# Patient Record
Sex: Female | Born: 1965 | Race: White | Hispanic: Yes | Marital: Married | Smoking: Light tobacco smoker
Health system: Southern US, Community
[De-identification: ages and names within clinical notes are randomized; demographics above are authoritative.]

## PROBLEM LIST (undated history)

## (undated) DIAGNOSIS — F419 Anxiety disorder, unspecified: Secondary | ICD-10-CM

## (undated) HISTORY — DX: Anxiety disorder, unspecified: F41.9

## (undated) HISTORY — PX: TUBAL LIGATION: SHX77

## (undated) HISTORY — PX: BIOPSY THYROID: PRO38

## (undated) HISTORY — PX: UPPER GASTROINTESTINAL ENDOSCOPY: SHX188

---

## 2002-04-11 ENCOUNTER — Inpatient Hospital Stay (HOSPITAL_COMMUNITY): Admission: AD | Admit: 2002-04-11 | Discharge: 2002-04-11 | Payer: Self-pay | Admitting: Obstetrics and Gynecology

## 2002-04-17 ENCOUNTER — Encounter (HOSPITAL_COMMUNITY): Admission: RE | Admit: 2002-04-17 | Discharge: 2002-05-17 | Payer: Self-pay | Admitting: *Deleted

## 2002-04-26 ENCOUNTER — Encounter: Admission: RE | Admit: 2002-04-26 | Discharge: 2002-05-22 | Payer: Self-pay | Admitting: *Deleted

## 2002-05-02 ENCOUNTER — Encounter: Admission: RE | Admit: 2002-05-02 | Discharge: 2002-05-02 | Payer: Self-pay | Admitting: *Deleted

## 2002-05-09 ENCOUNTER — Encounter: Admission: RE | Admit: 2002-05-09 | Discharge: 2002-05-09 | Payer: Self-pay | Admitting: *Deleted

## 2002-05-16 ENCOUNTER — Encounter: Admission: RE | Admit: 2002-05-16 | Discharge: 2002-05-16 | Payer: Self-pay | Admitting: *Deleted

## 2002-05-23 ENCOUNTER — Encounter: Admission: RE | Admit: 2002-05-23 | Discharge: 2002-05-23 | Payer: Self-pay | Admitting: *Deleted

## 2002-05-24 ENCOUNTER — Inpatient Hospital Stay (HOSPITAL_COMMUNITY): Admission: AD | Admit: 2002-05-24 | Discharge: 2002-05-24 | Payer: Self-pay | Admitting: *Deleted

## 2002-05-30 ENCOUNTER — Encounter: Admission: RE | Admit: 2002-05-30 | Discharge: 2002-05-30 | Payer: Self-pay | Admitting: *Deleted

## 2002-06-01 ENCOUNTER — Encounter (INDEPENDENT_AMBULATORY_CARE_PROVIDER_SITE_OTHER): Payer: Self-pay | Admitting: *Deleted

## 2002-06-01 ENCOUNTER — Inpatient Hospital Stay (HOSPITAL_COMMUNITY): Admission: AD | Admit: 2002-06-01 | Discharge: 2002-06-03 | Payer: Self-pay | Admitting: *Deleted

## 2003-04-23 ENCOUNTER — Encounter: Payer: Self-pay | Admitting: Family Medicine

## 2003-04-23 ENCOUNTER — Encounter (HOSPITAL_COMMUNITY): Admission: RE | Admit: 2003-04-23 | Discharge: 2003-07-22 | Payer: Self-pay | Admitting: Family Medicine

## 2003-04-24 ENCOUNTER — Encounter: Payer: Self-pay | Admitting: Family Medicine

## 2003-11-28 ENCOUNTER — Ambulatory Visit (HOSPITAL_COMMUNITY): Admission: RE | Admit: 2003-11-28 | Discharge: 2003-11-28 | Payer: Self-pay | Admitting: Family Medicine

## 2003-12-02 ENCOUNTER — Ambulatory Visit (HOSPITAL_COMMUNITY): Admission: RE | Admit: 2003-12-02 | Discharge: 2003-12-02 | Payer: Self-pay | Admitting: Family Medicine

## 2004-10-27 ENCOUNTER — Ambulatory Visit: Payer: Self-pay | Admitting: Family Medicine

## 2004-10-27 ENCOUNTER — Ambulatory Visit: Payer: Self-pay | Admitting: *Deleted

## 2004-11-10 ENCOUNTER — Ambulatory Visit (HOSPITAL_COMMUNITY): Admission: RE | Admit: 2004-11-10 | Discharge: 2004-11-10 | Payer: Self-pay | Admitting: Family Medicine

## 2004-11-27 ENCOUNTER — Ambulatory Visit: Payer: Self-pay | Admitting: Family Medicine

## 2004-12-22 ENCOUNTER — Other Ambulatory Visit: Admission: RE | Admit: 2004-12-22 | Discharge: 2004-12-22 | Payer: Self-pay | Admitting: Interventional Radiology

## 2004-12-22 ENCOUNTER — Encounter: Admission: RE | Admit: 2004-12-22 | Discharge: 2004-12-22 | Payer: Self-pay | Admitting: Family Medicine

## 2004-12-22 ENCOUNTER — Encounter (INDEPENDENT_AMBULATORY_CARE_PROVIDER_SITE_OTHER): Payer: Self-pay | Admitting: *Deleted

## 2005-02-01 ENCOUNTER — Ambulatory Visit: Payer: Self-pay | Admitting: Family Medicine

## 2005-05-04 ENCOUNTER — Ambulatory Visit: Payer: Self-pay | Admitting: Family Medicine

## 2005-05-06 ENCOUNTER — Ambulatory Visit (HOSPITAL_COMMUNITY): Admission: RE | Admit: 2005-05-06 | Discharge: 2005-05-06 | Payer: Self-pay | Admitting: Family Medicine

## 2005-05-13 ENCOUNTER — Ambulatory Visit: Payer: Self-pay | Admitting: Family Medicine

## 2006-01-19 ENCOUNTER — Ambulatory Visit: Payer: Self-pay | Admitting: Family Medicine

## 2006-03-03 ENCOUNTER — Ambulatory Visit: Payer: Self-pay | Admitting: Family Medicine

## 2006-03-03 ENCOUNTER — Encounter (INDEPENDENT_AMBULATORY_CARE_PROVIDER_SITE_OTHER): Payer: Self-pay | Admitting: Family Medicine

## 2006-10-03 ENCOUNTER — Ambulatory Visit: Payer: Self-pay | Admitting: Family Medicine

## 2006-10-25 ENCOUNTER — Ambulatory Visit (HOSPITAL_COMMUNITY): Admission: RE | Admit: 2006-10-25 | Discharge: 2006-10-25 | Payer: Self-pay | Admitting: Family Medicine

## 2006-10-25 ENCOUNTER — Encounter (INDEPENDENT_AMBULATORY_CARE_PROVIDER_SITE_OTHER): Payer: Self-pay | Admitting: Cardiology

## 2006-11-07 ENCOUNTER — Ambulatory Visit: Payer: Self-pay | Admitting: Family Medicine

## 2007-01-02 ENCOUNTER — Ambulatory Visit: Payer: Self-pay | Admitting: Family Medicine

## 2007-02-15 ENCOUNTER — Ambulatory Visit: Payer: Self-pay | Admitting: Family Medicine

## 2007-03-03 ENCOUNTER — Emergency Department (HOSPITAL_COMMUNITY): Admission: EM | Admit: 2007-03-03 | Discharge: 2007-03-03 | Payer: Self-pay | Admitting: Emergency Medicine

## 2007-03-07 ENCOUNTER — Ambulatory Visit: Payer: Self-pay | Admitting: Family Medicine

## 2007-06-07 ENCOUNTER — Encounter (INDEPENDENT_AMBULATORY_CARE_PROVIDER_SITE_OTHER): Payer: Self-pay | Admitting: *Deleted

## 2007-12-06 ENCOUNTER — Ambulatory Visit: Payer: Self-pay | Admitting: Internal Medicine

## 2008-01-16 ENCOUNTER — Ambulatory Visit: Payer: Self-pay | Admitting: Family Medicine

## 2008-01-25 ENCOUNTER — Encounter (INDEPENDENT_AMBULATORY_CARE_PROVIDER_SITE_OTHER): Payer: Self-pay | Admitting: Family Medicine

## 2008-01-25 ENCOUNTER — Ambulatory Visit: Payer: Self-pay | Admitting: Internal Medicine

## 2008-01-25 LAB — CONVERTED CEMR LAB
ALT: 13 units/L (ref 0–35)
AST: 15 units/L (ref 0–37)
Albumin: 4.7 g/dL (ref 3.5–5.2)
Basophils Absolute: 0.1 10*3/uL (ref 0.0–0.1)
Basophils Relative: 1 % (ref 0–1)
CO2: 22 meq/L (ref 19–32)
Calcium: 9.3 mg/dL (ref 8.4–10.5)
Chlamydia, DNA Probe: NEGATIVE
Chloride: 102 meq/L (ref 96–112)
Cholesterol: 191 mg/dL (ref 0–200)
Creatinine, Ser: 0.69 mg/dL (ref 0.40–1.20)
GC Probe Amp, Genital: NEGATIVE
Hemoglobin: 13.2 g/dL (ref 12.0–15.0)
Lymphocytes Relative: 37 % (ref 12–46)
MCHC: 33.6 g/dL (ref 30.0–36.0)
Monocytes Absolute: 0.4 10*3/uL (ref 0.1–1.0)
Neutro Abs: 2.2 10*3/uL (ref 1.7–7.7)
Neutrophils Relative %: 40 % — ABNORMAL LOW (ref 43–77)
Potassium: 4.4 meq/L (ref 3.5–5.3)
RDW: 13 % (ref 11.5–15.5)
Sodium: 137 meq/L (ref 135–145)
Total CHOL/HDL Ratio: 3.5
Total Protein: 8 g/dL (ref 6.0–8.3)

## 2008-01-26 ENCOUNTER — Ambulatory Visit (HOSPITAL_COMMUNITY): Admission: RE | Admit: 2008-01-26 | Discharge: 2008-01-26 | Payer: Self-pay | Admitting: Family Medicine

## 2008-01-31 ENCOUNTER — Ambulatory Visit (HOSPITAL_COMMUNITY): Admission: RE | Admit: 2008-01-31 | Discharge: 2008-01-31 | Payer: Self-pay | Admitting: Family Medicine

## 2008-10-18 ENCOUNTER — Ambulatory Visit: Payer: Self-pay | Admitting: Family Medicine

## 2008-12-19 ENCOUNTER — Ambulatory Visit: Payer: Self-pay | Admitting: Family Medicine

## 2008-12-24 ENCOUNTER — Encounter: Admission: RE | Admit: 2008-12-24 | Discharge: 2008-12-24 | Payer: Self-pay | Admitting: Family Medicine

## 2009-01-30 ENCOUNTER — Encounter: Admission: RE | Admit: 2009-01-30 | Discharge: 2009-01-30 | Payer: Self-pay | Admitting: Family Medicine

## 2009-06-24 ENCOUNTER — Ambulatory Visit: Payer: Self-pay | Admitting: Family Medicine

## 2009-06-24 LAB — CONVERTED CEMR LAB
BUN: 12 mg/dL (ref 6–23)
Chloride: 103 meq/L (ref 96–112)
Helicobacter Pylori Antibody-IgG: 1.4 — ABNORMAL HIGH
Potassium: 3.8 meq/L (ref 3.5–5.3)
Sodium: 139 meq/L (ref 135–145)
T3 Uptake Ratio: 30.7 % (ref 22.5–37.0)
T4, Total: 9 ug/dL (ref 5.0–12.5)
TSH: 2.645 microintl units/mL (ref 0.350–4.500)
Vit D, 25-Hydroxy: 20 ng/mL — ABNORMAL LOW (ref 30–89)

## 2009-09-14 ENCOUNTER — Emergency Department (HOSPITAL_COMMUNITY): Admission: EM | Admit: 2009-09-14 | Discharge: 2009-09-14 | Payer: Self-pay | Admitting: Emergency Medicine

## 2010-02-02 ENCOUNTER — Encounter: Admission: RE | Admit: 2010-02-02 | Discharge: 2010-02-02 | Payer: Self-pay | Admitting: Family Medicine

## 2010-04-17 ENCOUNTER — Emergency Department (HOSPITAL_COMMUNITY): Admission: EM | Admit: 2010-04-17 | Discharge: 2010-04-17 | Payer: Self-pay | Admitting: Emergency Medicine

## 2010-08-04 ENCOUNTER — Encounter (INDEPENDENT_AMBULATORY_CARE_PROVIDER_SITE_OTHER): Payer: Self-pay | Admitting: Family Medicine

## 2010-08-04 LAB — CONVERTED CEMR LAB
Basophils Absolute: 0 10*3/uL (ref 0.0–0.1)
Calcium: 9.4 mg/dL (ref 8.4–10.5)
Eosinophils Absolute: 0.2 10*3/uL (ref 0.0–0.7)
Eosinophils Relative: 3 % (ref 0–5)
HCT: 37.6 % (ref 36.0–46.0)
Lymphocytes Relative: 33 % (ref 12–46)
Neutrophils Relative %: 55 % (ref 43–77)
Platelets: 272 10*3/uL (ref 150–400)
Potassium: 3.8 meq/L (ref 3.5–5.3)
RDW: 13.2 % (ref 11.5–15.5)
Sodium: 140 meq/L (ref 135–145)
WBC: 5.7 10*3/uL (ref 4.0–10.5)

## 2010-10-10 ENCOUNTER — Encounter: Payer: Self-pay | Admitting: Family Medicine

## 2010-10-11 ENCOUNTER — Encounter: Payer: Self-pay | Admitting: Family Medicine

## 2010-12-21 LAB — URINE MICROSCOPIC-ADD ON

## 2010-12-21 LAB — CBC
HCT: 32.4 % — ABNORMAL LOW (ref 36.0–46.0)
Hemoglobin: 11.4 g/dL — ABNORMAL LOW (ref 12.0–15.0)
MCV: 91.2 fL (ref 78.0–100.0)
WBC: 5 10*3/uL (ref 4.0–10.5)

## 2010-12-21 LAB — DIFFERENTIAL
Basophils Relative: 0 % (ref 0–1)
Eosinophils Absolute: 0.2 10*3/uL (ref 0.0–0.7)
Eosinophils Relative: 4 % (ref 0–5)
Monocytes Absolute: 0.4 10*3/uL (ref 0.1–1.0)
Monocytes Relative: 8 % (ref 3–12)

## 2010-12-21 LAB — URINALYSIS, ROUTINE W REFLEX MICROSCOPIC
Bilirubin Urine: NEGATIVE
Glucose, UA: NEGATIVE mg/dL
Ketones, ur: NEGATIVE mg/dL
Protein, ur: NEGATIVE mg/dL

## 2010-12-21 LAB — COMPREHENSIVE METABOLIC PANEL
Alkaline Phosphatase: 55 U/L (ref 39–117)
BUN: 9 mg/dL (ref 6–23)
CO2: 24 mEq/L (ref 19–32)
Chloride: 103 mEq/L (ref 96–112)
GFR calc non Af Amer: >60 mL/min (ref >60)
Glucose, Bld: 101 mg/dL — ABNORMAL HIGH (ref 70–99)
Potassium: 3.1 mEq/L — ABNORMAL LOW (ref 3.5–5.1)
Total Bilirubin: 0.3 mg/dL (ref 0.3–1.2)

## 2011-02-05 NOTE — Op Note (Signed)
   NAMENOHEA, KRAS                        ACCOUNT NO.:  [id number]   MEDICAL RECORD NO.:  [id number]                   PATIENT TYPE:  INP   LOCATION:  9129                                 FACILITY:  [facility]   PHYSICIAN:  Enid Cutter, M.D.                  DATE OF BIRTH:  [birth year]   DATE OF PROCEDURE:  06/02/2002  DATE OF DISCHARGE:                                 OPERATIVE REPORT   IDENTIFYING INFORMATION:  A 45 year old multiparous female.   PREOPERATIVE DIAGNOSIS:  Undesired fertility.   PREOPERATIVE DIAGNOSIS:  Undesired fertility.   PROCEDURE:  Postpartum bilateral tubal ligation with right distal  salpingectomy secondary to large paratubal cyst.   SURGEON:  Enid Cutter, M.D.   ESTIMATED BLOOD LOSS:  Minimal.   DISPOSITION:  Recovery room.   FINDINGS:  Normal uterus, left tube, and ovary and normal right ovary,  approximately 3-cm paratubal cyst and  .5-cm paratubal cyst on the distal right tube.   PROCEDURE:  The patient was taken to the operating room where she was given  epidural anesthesia bolus.  She was prepped and draped in sterile fashion.  Incision made in the umbilicus with a scalpel and carried down to the  underlying fascia with the Mayo scissors.  The fascia was entered sharply in  the midline.  The peritoneum was entered sharply as well.  The patient's  left fallopian tube was identified and grasped with a Babcock clamp.  It was  identified by the fimbriated end and ligated approximately three cm from the  cornual region.  A 1-cm segment of tube was excised.  Attention was then  turned to the right tube which was followed to the distal end.  There was a  large paratubal cyst.  At this time, decision was made to perform a distal  right salpingectomy to excise the tube and cyst at same time.  The tube was  doubly ligated with 0 plain gut.  The tube was excised and handed off for  pathology.  The tubes bilaterally were inspected and noted to be  hemostatic.  The fascia was closed with a running stitch of 0 Vicryl.  The subcuticular  stitch was used to reapproximate the skin edges with 4-0 Monocryl.  The  patient tolerated the procedure well and returned to the recovery room in  stable condition.                                                Enid Cutter, M.D.    [facility]MED[facility]  D:  06/02/2002  T:  06/02/2002  Job:  [id number]

## 2011-07-08 LAB — CBC
HCT: 34 — ABNORMAL LOW
Platelets: 300
RBC: 3.88
WBC: 4.4

## 2011-07-08 LAB — I-STAT 8, (EC8 V) (CONVERTED LAB)
BUN: 14
Bicarbonate: 27.9 — ABNORMAL HIGH
HCT: 36
Hemoglobin: 12.2
Operator id: 161631
Sodium: 140
TCO2: 29

## 2011-07-08 LAB — DIFFERENTIAL
Eosinophils Relative: 6 — ABNORMAL HIGH
Lymphocytes Relative: 41
Lymphs Abs: 1.8

## 2011-07-08 LAB — POCT I-STAT CREATININE: Operator id: 161631

## 2011-07-08 LAB — D-DIMER, QUANTITATIVE: D-Dimer, Quant: 0.88 — ABNORMAL HIGH

## 2011-11-03 ENCOUNTER — Emergency Department (HOSPITAL_COMMUNITY)
Admission: EM | Admit: 2011-11-03 | Discharge: 2011-11-03 | Disposition: A | Discharge status: Home / Self Care | Payer: Self-pay | Attending: Emergency Medicine | Admitting: Emergency Medicine

## 2011-11-03 DIAGNOSIS — M542 Cervicalgia: Secondary | ICD-10-CM | POA: Insufficient documentation

## 2011-11-03 DIAGNOSIS — R220 Localized swelling, mass and lump, head: Secondary | ICD-10-CM | POA: Insufficient documentation

## 2011-11-03 DIAGNOSIS — M436 Torticollis: Secondary | ICD-10-CM | POA: Insufficient documentation

## 2011-11-03 DIAGNOSIS — M62838 Other muscle spasm: Secondary | ICD-10-CM | POA: Insufficient documentation

## 2011-11-03 MED ORDER — HYDROCODONE-ACETAMINOPHEN 5-325 MG PO TABS: 1.0000 | ORAL_TABLET | Freq: Four times a day (QID) | ORAL | Status: AC | PRN

## 2011-11-03 MED ORDER — HYDROCODONE-ACETAMINOPHEN 5-325 MG PO TABS
1.0000 | ORAL_TABLET | Freq: Once | ORAL | Status: AC
  Administered 2011-11-03: 1 via ORAL
  Filled 2011-11-03: qty 1

## 2011-11-03 MED ORDER — CYCLOBENZAPRINE HCL 10 MG PO TABS: 10.0000 mg | ORAL_TABLET | Freq: Three times a day (TID) | ORAL | Status: AC | PRN

## 2011-11-03 NOTE — Discharge Instructions (Signed)
Suspect sternocleidal mastoid muscle spasm. Take muscle relaxer and pain medicine as needed. Can continue Advil. But with history of thyroid problems also concerned about underlying thyroid mass. Resource guide provided to help you find a primary care provider in the meantime: Urgent care can be used. The thyroid should be followed up sometime in next 2 weeks.

## 2011-11-03 NOTE — ED Notes (Signed)
Rt neck pain x 3 days feel swollen no injury makes her face hurt  No fever

## 2011-11-03 NOTE — ED Notes (Signed)
MD at bedside. 

## 2011-11-03 NOTE — ED Provider Notes (Signed)
History     CSN: [id number]  Arrival date & time 11/03/11  1221   First MD Initiated Contact with Patient 11/03/11 1316      Chief Complaint  Patient presents with  . Neck Pain    (Consider location/radiation/quality/duration/timing/severity/associated sxs/prior treatment) The history is provided by the patient.   patient is a 46 year old female with 3 day history of acute onset of right-sided neck pain made worse by moving the head also feels her swelling in that area. Patient with past history of thyroid issues and is concerned about that. Currently does not have a primary care provider. Pain is 10 out of 10 radiates up into the sole area. No teeth or mouth pain. No sore throat. Pain came on suddenly with movement of head.  No past medical history on file.  No past surgical history on file.  No family history on file.  History  Substance Use Topics  . Smoking status: Not on file  . Smokeless tobacco: Not on file  . Alcohol Use: Not on file    OB History    No data available      Review of Systems  Constitutional: Negative for fever and chills.  HENT: Positive for neck pain and neck stiffness. Negative for congestion and sore throat.   Eyes: Negative for redness.  Respiratory: Negative for cough and shortness of breath.   Cardiovascular: Negative for chest pain.  Gastrointestinal: Negative for abdominal pain.  Genitourinary: Negative for dysuria and hematuria.  Musculoskeletal: Negative for back pain.  Skin: Negative for rash.  Neurological: Negative for headaches.  Hematological: Negative for adenopathy.    Allergies  Review of patient's allergies indicates no known allergies.  Home Medications   Current Outpatient Rx  Name Route Sig Dispense Refill  . IBUPROFEN 200 MG PO TABS Oral Take 200 mg by mouth every 6 (six) hours as needed. For pain    . CYCLOBENZAPRINE HCL 10 MG PO TABS Oral Take 1 tablet (10 mg total) by mouth 3 (three) times daily as needed for  muscle spasms. 20 tablet 0  . HYDROCODONE-ACETAMINOPHEN 5-325 MG PO TABS Oral Take 1-2 tablets by mouth every 6 (six) hours as needed for pain. 14 tablet 0    BP 124/76  Pulse 60  Temp 98.2 F (36.8 C)  Resp 16  SpO2 100%  Physical Exam  Nursing note and vitals reviewed. Constitutional: She is oriented to person, place, and time. She appears well-developed and well-nourished. No distress.  HENT:  Head: Normocephalic and atraumatic.  Mouth/Throat: Oropharynx is clear and moist.  Eyes: Conjunctivae and EOM are normal. Pupils are equal, round, and reactive to light.  Neck: No tracheal deviation present.       Spasm of sternal branch of the sternocleidomastoid muscle swelling in that area not able to exclude underlying thyroid mass. No cervical adenopathy  Cardiovascular: Normal rate, regular rhythm, normal heart sounds and intact distal pulses.   No murmur heard. Pulmonary/Chest: Effort normal and breath sounds normal.  Abdominal: Soft. Bowel sounds are normal. There is no tenderness.  Musculoskeletal: Normal range of motion.  Lymphadenopathy:    She has no cervical adenopathy.  Neurological: She is alert and oriented to person, place, and time. No cranial nerve deficit. She exhibits normal muscle tone. Coordination normal.  Skin: Skin is warm. No rash noted.    ED Course  Procedures (including critical care time)  Labs Reviewed - No data to display No results found.   1. Torticollis, acute  MDM   Clinically with spasm of the right sternocleidomastoid mastoid muscle area however patient has history of thyroid problems in the past swelling in that area could be related to thyroid problem. It's important that she follows up for a thyroid issue sometime in the next 2 weeks resource guide provided to help her with that. In the meantime will treat with pain medicine and muscle relaxers.        Shelda Jakes, MD 11/03/11 [phone]

## 2012-06-12 ENCOUNTER — Ambulatory Visit: Payer: Self-pay | Admitting: Family

## 2012-06-28 ENCOUNTER — Other Ambulatory Visit (HOSPITAL_COMMUNITY): Payer: Self-pay | Admitting: Family Medicine

## 2012-06-28 DIAGNOSIS — E010 Iodine-deficiency related diffuse (endemic) goiter: Secondary | ICD-10-CM

## 2012-06-30 ENCOUNTER — Ambulatory Visit (HOSPITAL_COMMUNITY)
Admission: RE | Admit: 2012-06-30 | Discharge: 2012-06-30 | Disposition: A | Discharge status: Home / Self Care | Payer: Self-pay | Source: Ambulatory Visit | Attending: Family Medicine | Admitting: Family Medicine

## 2012-06-30 DIAGNOSIS — E010 Iodine-deficiency related diffuse (endemic) goiter: Secondary | ICD-10-CM

## 2012-06-30 DIAGNOSIS — E041 Nontoxic single thyroid nodule: Secondary | ICD-10-CM | POA: Insufficient documentation

## 2013-10-13 ENCOUNTER — Ambulatory Visit: Payer: Self-pay | Admitting: Emergency Medicine

## 2013-10-13 VITALS — BP 128/86 | HR 78 | Temp 98.1°F | Resp 16 | Ht 64.0 in | Wt 150.6 lb

## 2013-10-13 DIAGNOSIS — R350 Frequency of micturition: Secondary | ICD-10-CM

## 2013-10-13 DIAGNOSIS — N3000 Acute cystitis without hematuria: Secondary | ICD-10-CM

## 2013-10-13 LAB — POCT UA - MICROSCOPIC ONLY
Casts, Ur, LPF, POC: NEGATIVE
Crystals, Ur, HPF, POC: NEGATIVE
Mucus, UA: NEGATIVE
Yeast, UA: NEGATIVE

## 2013-10-13 LAB — POCT URINALYSIS DIPSTICK
Bilirubin, UA: NEGATIVE
Glucose, UA: NEGATIVE
Ketones, UA: NEGATIVE
Nitrite, UA: NEGATIVE
Protein, UA: 30
Spec Grav, UA: 1.01
Urobilinogen, UA: 0.2
pH, UA: 5.5

## 2013-10-13 MED ORDER — SULFAMETHOXAZOLE-TMP DS 800-160 MG PO TABS: 1.0000 | ORAL_TABLET | Freq: Two times a day (BID) | ORAL | Status: DC

## 2013-10-13 MED ORDER — PHENAZOPYRIDINE HCL 200 MG PO TABS: 200.0000 mg | ORAL_TABLET | Freq: Three times a day (TID) | ORAL | Status: DC | PRN

## 2013-10-13 NOTE — Patient Instructions (Signed)
  Infeccin urinaria  (Urinary Tract Infection)  La infeccin urinaria puede ocurrir en cualquier lugar del tracto urinario. El tracto urinario es un sistema de drenaje del cuerpo por el que se eliminan los desechos y el exceso de agua. El tracto urinario est formado por dos riones, dos urteres, la vejiga y la uretra. Los riones son rganos que tienen forma de frijol. Cada rin tiene aproximadamente el tamao del puo. Estn situados debajo de las costillas, uno a cada lado de la columna vertebral CAUSAS  La causa de la infeccin son los microbios, que son organismos microscpicos, que incluyen hongos, virus, y bacterias. Estos organismos son tan pequeos que slo pueden verse a travs del microscopio. Las bacterias son los microorganismos que ms comnmente causan infecciones urinarias.  SNTOMAS  Los sntomas pueden variar segn la edad y el sexo del paciente y por la ubicacin de la infeccin. Los sntomas en las mujeres jvenes incluyen la necesidad frecuente e intensa de orinar y una sensacin dolorosa de ardor en la vejiga o en la uretra durante la miccin. Las mujeres y los hombres mayores podrn sentir cansancio, temblores y debilidad y sentir dolores musculares y dolor abdominal. Si tiene fiebre, puede significar que la infeccin est en los riones. Otros sntomas son dolor en la espalda o en los lados debajo de las costillas, nuseas y vmitos.  DIAGNSTICO  Para diagnosticar una infeccin urinaria, el mdico le preguntar acerca de sus sntomas. Tambin le solicitar una muestra de orina. La muestra de orina se analiza para detectar bacterias y glbulos blancos de la sangre. Los glbulos blancos se forman en el organismo para ayudar a combatir las infecciones.  TRATAMIENTO  Por lo general, las infecciones urinarias pueden tratarse con medicamentos. Debido a que la mayora de las infecciones son causadas por bacterias, por lo general pueden tratarse con antibiticos. La eleccin del  antibitico y la duracin del tratamiento depender de sus sntomas y el tipo de bacteria causante de la infeccin.  INSTRUCCIONES PARA EL CUIDADO EN EL HOGAR   Si le recetaron antibiticos, tmelos exactamente como su mdico le indique. Termine el medicamento aunque se sienta mejor despus de haber tomado slo algunos.  Beba gran cantidad de lquido para mantener la orina de tono claro o color amarillo plido.  Evite la cafena, el t y las bebidas gaseosas. Estas sustancias irritan la vejiga.  Vaciar la vejiga con frecuencia. Evite retener la orina durante largos perodos.  Vace la vejiga antes y despus de tener relaciones sexuales.  Despus de mover el intestino, las mujeres deben higienizarse la regin perineal desde adelante hacia atrs. Use slo un papel tissue por vez. SOLICITE ATENCIN MDICA SI:   Siente dolor en la espalda.  Le sube la fiebre.  Los sntomas no mejoran luego de 3 das. SOLICITE ATENCIN MDICA DE INMEDIATO SI:   Siente dolor intenso en la espalda o en la zona inferior del abdomen.  Comienza a sentir escalofros.  Tiene nuseas o vmitos.  Tiene una sensacin continua de quemazn o molestias al orinar. ASEGRESE DE QUE:   Comprende estas instrucciones.  Controlar su enfermedad.  Solicitar ayuda de inmediato si no mejora o empeora. Document Released: 06/16/2005 Document Revised: 05/31/2012 [employer] Patient Information 2014 [employer].  

## 2013-10-13 NOTE — Progress Notes (Signed)
[facility] [address] [place] [address] [phone]  Date:  10/13/2013   Name:  Amy Wyatt   DOB:  1966/09/07   MRN:  [id number]  PCP:  Darvin Neighbours    Chief Complaint: Urinary Tract Infection   History of Present Illness:  Amy Wyatt is a 48 y.o. very pleasant female patient who presents with the following:  3 day history of dysuria, urgency and frequency.  No blood in urine.  No vaginal discharge.  No vaginal bleeding. No fever or chills.  No nausea or vomiting.  No stool change.  Some back and suprapubic discomfort.  No improvement with over the counter medications or other home remedies. Denies other complaint or health concern today.   There are no active problems to display for this patient.   History reviewed. No pertinent past medical history.  History reviewed. No pertinent past surgical history.  History  Substance Use Topics  . Smoking status: Never Smoker   . Smokeless tobacco: Not on file  . Alcohol Use: Yes     Comment: sometime    History reviewed. No pertinent family history.  No Known Allergies  Medication list has been reviewed and updated.  Current Outpatient Prescriptions on File Prior to Visit  Medication Sig Dispense Refill  . ibuprofen (ADVIL,MOTRIN) 200 MG tablet Take 200 mg by mouth every 6 (six) hours as needed. For pain       No current facility-administered medications on file prior to visit.    Review of Systems:  As per HPI, otherwise negative.    Physical Examination: Filed Vitals:   10/13/13 1756  BP: 128/86  Pulse: 78  Temp: 98.1 F (36.7 C)  Resp: 16   Filed Vitals:   10/13/13 1756  Height: 5\' 4"  (1.626 m)  Weight: 150 lb 9.6 oz (68.312 kg)   Body mass index is 25.84 kg/(m^2). Ideal Body Weight: Weight in (lb) to have BMI = 25: 145.3  GEN: WDWN, NAD, Non-toxic, A & O x 3 HEENT: Atraumatic, Normocephalic. Neck supple. No masses, No LAD. Ears and Nose: No external  deformity. CV: RRR, No M/G/R. No JVD. No thrill. No extra heart sounds. PULM: CTA B, no wheezes, crackles, rhonchi. No retractions. No resp. distress. No accessory muscle use. ABD: S, suprapubic tenderness , ND, +BS. No rebound. No HSM. EXTR: No c/c/e NEURO Normal gait.  PSYCH: Normally interactive. Conversant. Not depressed or anxious appearing.  Calm demeanor.     Assessment and Plan: Hemorrhagic cystitis Septra ds Pyridium   Signed,  Phillips Odor, MD   Results for orders placed in visit on 10/13/13  POCT UA - MICROSCOPIC ONLY      Result Value Range   WBC, Ur, HPF, POC 4-8     RBC, urine, microscopic tntc     Bacteria, U Microscopic trace     Mucus, UA neg     Epithelial cells, urine per micros 0-3     Crystals, Ur, HPF, POC neg     Casts, Ur, LPF, POC neg     Yeast, UA neg    POCT URINALYSIS DIPSTICK      Result Value Range   Color, UA yellow     Clarity, UA cloudy     Glucose, UA neg     Bilirubin, UA neg     Ketones, UA neg     Spec Grav, UA 1.010     Blood, UA large  pH, UA 5.5     Protein, UA 30     Urobilinogen, UA 0.2     Nitrite, UA neg     Leukocytes, UA small (1+)

## 2014-03-05 ENCOUNTER — Ambulatory Visit: Payer: Self-pay | Admitting: Internal Medicine

## 2014-05-24 ENCOUNTER — Ambulatory Visit: Payer: Self-pay | Admitting: Gastroenterology

## 2014-05-30 ENCOUNTER — Ambulatory Visit: Payer: Self-pay | Admitting: Obstetrics and Gynecology

## 2014-05-31 LAB — PATHOLOGY REPORT

## 2014-12-05 ENCOUNTER — Ambulatory Visit (INDEPENDENT_AMBULATORY_CARE_PROVIDER_SITE_OTHER): Payer: 59 | Admitting: Physician Assistant

## 2014-12-05 VITALS — BP 110/80 | HR 96 | Temp 99.6°F | Resp 16 | Ht 61.0 in | Wt 152.0 lb

## 2014-12-05 DIAGNOSIS — R05 Cough: Secondary | ICD-10-CM | POA: Diagnosis not present

## 2014-12-05 DIAGNOSIS — M791 Myalgia, unspecified site: Secondary | ICD-10-CM

## 2014-12-05 DIAGNOSIS — J101 Influenza due to other identified influenza virus with other respiratory manifestations: Secondary | ICD-10-CM | POA: Diagnosis not present

## 2014-12-05 DIAGNOSIS — R059 Cough, unspecified: Secondary | ICD-10-CM

## 2014-12-05 LAB — POCT INFLUENZA A/B
INFLUENZA A, POC: POSITIVE
Influenza B, POC: NEGATIVE

## 2014-12-05 MED ORDER — HYDROCOD POLST-CHLORPHEN POLST 10-8 MG/5ML PO LQCR: 5.0000 mL | Freq: Two times a day (BID) | ORAL | Status: DC | PRN

## 2014-12-05 MED ORDER — OSELTAMIVIR PHOSPHATE 75 MG PO CAPS: 75.0000 mg | ORAL_CAPSULE | Freq: Two times a day (BID) | ORAL | Status: DC

## 2014-12-05 MED ORDER — OSELTAMIVIR PHOSPHATE 75 MG PO CAPS: 75.0000 mg | ORAL_CAPSULE | Freq: Two times a day (BID) | ORAL | Status: AC

## 2014-12-05 NOTE — Patient Instructions (Signed)
Please stay hydrated, with water and gatorade.  Drink more than 4 regular sized water bottles per day.  Take ibuprofen and or tylenol for pain.    Influenza Influenza ("the flu") is a viral infection of the respiratory tract. It occurs more often in winter months because people spend more time in close contact with one another. Influenza can make you feel very sick. Influenza easily spreads from person to person (contagious). CAUSES  Influenza is caused by a virus that infects the respiratory tract. You can catch the virus by breathing in droplets from an infected person's cough or sneeze. You can also catch the virus by touching something that was recently contaminated with the virus and then touching your mouth, nose, or eyes. RISKS AND COMPLICATIONS You may be at risk for a more severe case of influenza if you smoke cigarettes, have diabetes, have chronic heart disease (such as heart failure) or lung disease (such as asthma), or if you have a weakened immune system. Elderly people and pregnant women are also at risk for more serious infections. The most common problem of influenza is a lung infection (pneumonia). Sometimes, this problem can require emergency medical care and may be life threatening. SIGNS AND SYMPTOMS  Symptoms typically last 4 to 10 days and may include:  Fever.  Chills.  Headache, body aches, and muscle aches.  Sore throat.  Chest discomfort and cough.  Poor appetite.  Weakness or feeling tired.  Dizziness.  Nausea or vomiting. DIAGNOSIS  Diagnosis of influenza is often made based on your history and a physical exam. A nose or throat swab test can be done to confirm the diagnosis. TREATMENT  In mild cases, influenza goes away on its own. Treatment is directed at relieving symptoms. For more severe cases, your health care provider may prescribe antiviral medicines to shorten the sickness. Antibiotic medicines are not effective because the infection is caused by a  virus, not by bacteria. HOME CARE INSTRUCTIONS  Take medicines only as directed by your health care provider.  Use a cool mist humidifier to make breathing easier.  Get plenty of rest until your temperature returns to normal. This usually takes 3 to 4 days.  Drink enough fluid to keep your urine clear or pale yellow.  Cover yourmouth and nosewhen coughing or sneezing,and wash your handswellto prevent thevirusfrom spreading.  Stay homefromwork orschool untilthe fever is gonefor at least 261full day. PREVENTION  An annual influenza vaccination (flu shot) is the best way to avoid getting influenza. An annual flu shot is now routinely recommended for all adults in the [place] SEEK MEDICAL CARE IF:  You experiencechest pain, yourcough worsens,or you producemore mucus.  Youhave nausea,vomiting, ordiarrhea.  Your fever returns or gets worse. SEEK IMMEDIATE MEDICAL CARE IF:  You havetrouble breathing, you become short of breath,or your skin ornails becomebluish.  You have severe painor stiffnessin the neck.  You develop a sudden headache, or pain in the face or ear.  You have nausea or vomiting that you cannot control. MAKE SURE YOU:   Understand these instructions.  Will watch your condition.  Will get help right away if you are not doing well or get worse. Document Released: 09/03/2000 Document Revised: 01/21/2014 Document Reviewed: 12/06/2011 [facility] Patient Information 2015 [place], [place]. This information is not intended to replace advice given to you by your health care provider. Make sure you discuss any questions you have with your health care provider.

## 2014-12-05 NOTE — Progress Notes (Signed)
[facility] [address] [place] [address] [phone]  Date:  12/05/2014   Name:  Aleesia Henney   DOB:  1966/09/17   MRN:  [id number]  PCP:  Darvin Neighbours    Chief Complaint: Fever; Sore Throat; Cough; and Generalized Body Aches   History of Present Illness:  [address] Glender Augusta is a 49 y.o. very pleasant female patient who presents with the following:  Patient reports three days of fever, sore throat, non-productive cough, and generalized muscle ache.  She takes Advil and Nyquil, and spanish medication for fever.  She reports back ache along thighs, back, and neck.  Last administration medication was taken 4 hours ago.  She denies any abdominal pain, nausea, vomiting, or diarrhea.  She has no sob or dyspnea.    There are no active problems to display for this patient.   History reviewed. No pertinent past medical history.  History reviewed. No pertinent past surgical history.  History  Substance Use Topics  . Smoking status: Never Smoker   . Smokeless tobacco: Not on file  . Alcohol Use: Yes     Comment: sometime    History reviewed. No pertinent family history.  No Known Allergies  Medication list has been reviewed and updated.  Current Outpatient Prescriptions on File Prior to Visit  Medication Sig Dispense Refill  . ibuprofen (ADVIL,MOTRIN) 200 MG tablet Take 200 mg by mouth every 6 (six) hours as needed. For pain    . phenazopyridine (PYRIDIUM) 200 MG tablet Take 1 tablet (200 mg total) by mouth 3 (three) times daily as needed for pain. (Patient not taking: Reported on 12/05/2014) 6 tablet 0  . sulfamethoxazole-trimethoprim (BACTRIM DS) 800-160 MG per tablet Take 1 tablet by mouth 2 (two) times daily. (Patient not taking: Reported on 12/05/2014) 20 tablet 0   No current facility-administered medications on file prior to visit.    Review of Systems: ROS otherwise unremarkable unless listed above.    Physical  Examination: Filed Vitals:   12/05/14 2011  BP: 110/80  Pulse: 96  Temp: 99.6 F (37.6 C)  Resp: 16   Filed Vitals:   12/05/14 2011  Height:  (1.549 m)  Weight: 152 lb (68.947 kg)   Body mass index is 28.74 kg/(m^2). Ideal Body Weight: Weight in (lb) to have BMI = 25: 132  Physical Exam  Constitutional: She is oriented to person, place, and time. She appears well-developed and well-nourished.  HENT:  Head: Normocephalic and atraumatic.  Right Ear: Tympanic membrane, external ear and ear canal normal.  Left Ear: Tympanic membrane, external ear and ear canal normal.  Nose: Mucosal edema and rhinorrhea present. Right sinus exhibits no maxillary sinus tenderness and no frontal sinus tenderness. Left sinus exhibits no maxillary sinus tenderness and no frontal sinus tenderness.  Mouth/Throat: Oropharynx is clear and moist. No uvula swelling. No oropharyngeal exudate, posterior oropharyngeal edema or posterior oropharyngeal erythema.  Eyes: EOM are normal. Pupils are equal, round, and reactive to light. Right eye exhibits no discharge. Left eye exhibits no discharge.  Neck: Normal range of motion. Neck supple.  Cardiovascular: Normal rate and regular rhythm.  Exam reveals no gallop, no distant heart sounds and no friction rub.   No murmur heard. Pulmonary/Chest: Effort normal and breath sounds normal. No respiratory distress. She has no wheezes.  Abdominal: Soft. Bowel sounds are normal. She exhibits no distension. There is no tenderness.  Musculoskeletal: Normal range of motion. She exhibits no  edema or tenderness.  Lymphadenopathy:    She has no cervical adenopathy.  Neurological: She is alert and oriented to person, place, and time.  Skin: Skin is warm and dry.  Psychiatric: She has a normal mood and affect. Her behavior is normal.     Results for orders placed or performed in visit on 12/05/14  POCT Influenza A/B  Result Value Ref Range   Influenza A, POC Positive     Influenza B, POC Negative      Assessment and Plan: 49 year old female is here today for chief complaint of generalized muscle ache, sore throat, nasal congestion, and fever.  Influenza A - Plan: chlorpheniramine-HYDROcodone (TUSSIONEX PENNKINETIC ER) 10-8 MG/5ML LQCR, oseltamivir (TAMIFLU) 75 MG capsule, DISCONTINUED: oseltamivir (TAMIFLU) 75 MG capsule  Generalized muscle ache - Plan: POCT Influenza A/B  Cough - Plan: chlorpheniramine-HYDROcodone (TUSSIONEX PENNKINETIC ER) 10-8 MG/5ML Mare FerrariLQCR  Trena PlattStephanie Floetta Brickey, PA-C [facility] 3/17/20169:45 PM

## 2016-12-01 DIAGNOSIS — H5203 Hypermetropia, bilateral: Secondary | ICD-10-CM | POA: Diagnosis not present

## 2016-12-01 DIAGNOSIS — H11003 Unspecified pterygium of eye, bilateral: Secondary | ICD-10-CM | POA: Diagnosis not present

## 2016-12-01 DIAGNOSIS — H52223 Regular astigmatism, bilateral: Secondary | ICD-10-CM | POA: Diagnosis not present

## 2017-05-27 DIAGNOSIS — Z7689 Persons encountering health services in other specified circumstances: Secondary | ICD-10-CM | POA: Diagnosis not present

## 2017-08-15 ENCOUNTER — Other Ambulatory Visit: Payer: Self-pay | Admitting: Internal Medicine

## 2017-08-15 DIAGNOSIS — Z1231 Encounter for screening mammogram for malignant neoplasm of breast: Secondary | ICD-10-CM

## 2017-08-15 DIAGNOSIS — D649 Anemia, unspecified: Secondary | ICD-10-CM | POA: Diagnosis not present

## 2017-08-15 DIAGNOSIS — R1013 Epigastric pain: Secondary | ICD-10-CM | POA: Diagnosis not present

## 2017-08-15 DIAGNOSIS — Z23 Encounter for immunization: Secondary | ICD-10-CM | POA: Diagnosis not present

## 2017-08-15 DIAGNOSIS — E049 Nontoxic goiter, unspecified: Secondary | ICD-10-CM | POA: Diagnosis not present

## 2017-09-05 DIAGNOSIS — E049 Nontoxic goiter, unspecified: Secondary | ICD-10-CM | POA: Diagnosis not present

## 2017-09-07 ENCOUNTER — Ambulatory Visit
Admission: RE | Admit: 2017-09-07 | Discharge: 2017-09-07 | Disposition: A | Discharge status: Home / Self Care | Payer: 59 | Source: Ambulatory Visit | Attending: Internal Medicine | Admitting: Internal Medicine

## 2017-09-07 DIAGNOSIS — Z1231 Encounter for screening mammogram for malignant neoplasm of breast: Secondary | ICD-10-CM | POA: Diagnosis not present

## 2017-12-15 DIAGNOSIS — Z5181 Encounter for therapeutic drug level monitoring: Secondary | ICD-10-CM | POA: Diagnosis not present

## 2017-12-15 DIAGNOSIS — D649 Anemia, unspecified: Secondary | ICD-10-CM | POA: Diagnosis not present

## 2017-12-15 DIAGNOSIS — E049 Nontoxic goiter, unspecified: Secondary | ICD-10-CM | POA: Diagnosis not present

## 2018-10-18 ENCOUNTER — Other Ambulatory Visit: Payer: Self-pay | Admitting: Internal Medicine

## 2018-10-24 ENCOUNTER — Other Ambulatory Visit: Payer: Self-pay | Admitting: Internal Medicine

## 2018-10-24 DIAGNOSIS — Z1231 Encounter for screening mammogram for malignant neoplasm of breast: Secondary | ICD-10-CM

## 2018-11-09 ENCOUNTER — Ambulatory Visit
Admission: RE | Admit: 2018-11-09 | Discharge: 2018-11-09 | Disposition: A | Discharge status: Home / Self Care | Payer: Commercial Managed Care - PPO | Source: Ambulatory Visit | Attending: Internal Medicine | Admitting: Internal Medicine

## 2018-11-09 DIAGNOSIS — Z1231 Encounter for screening mammogram for malignant neoplasm of breast: Secondary | ICD-10-CM | POA: Diagnosis present

## 2019-03-08 IMAGING — MG DIGITAL SCREENING BILATERAL MAMMOGRAM WITH TOMO AND CAD
8 series · 9 of 24 positions shown · non-contrast
Comparison: Previous exam(s).

CLINICAL DATA: Screening.

EXAM:
DIGITAL SCREENING BILATERAL MAMMOGRAM WITH TOMO AND CAD

[L MLO synth-2D]
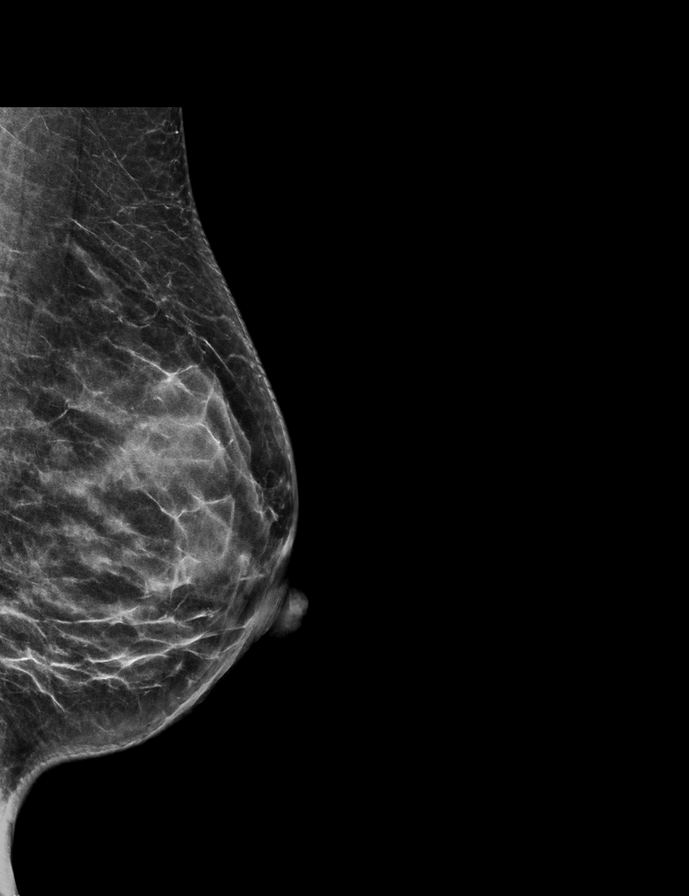

[R MLO synth-2D]
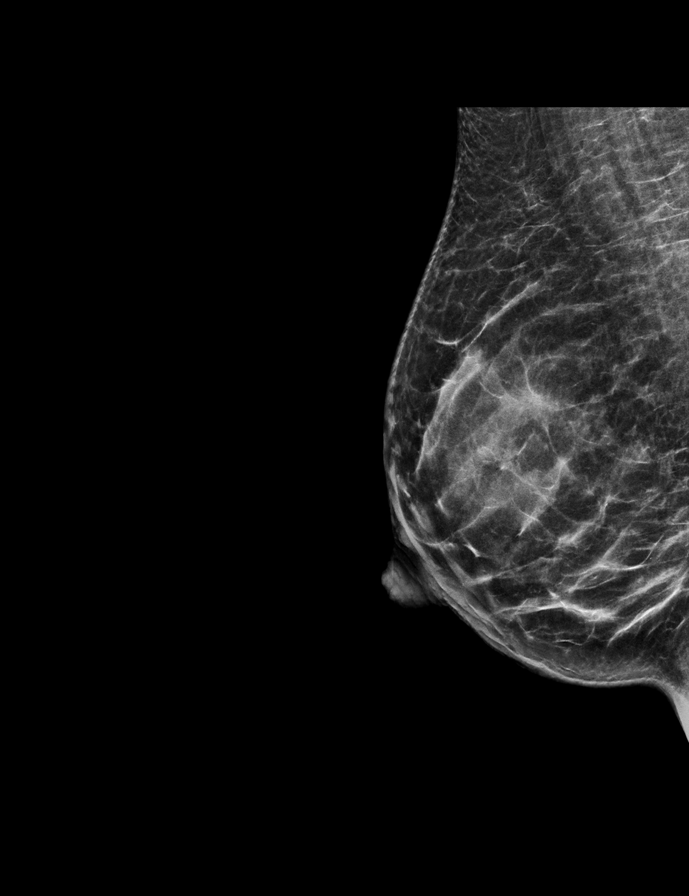

[R CC synth-2D]
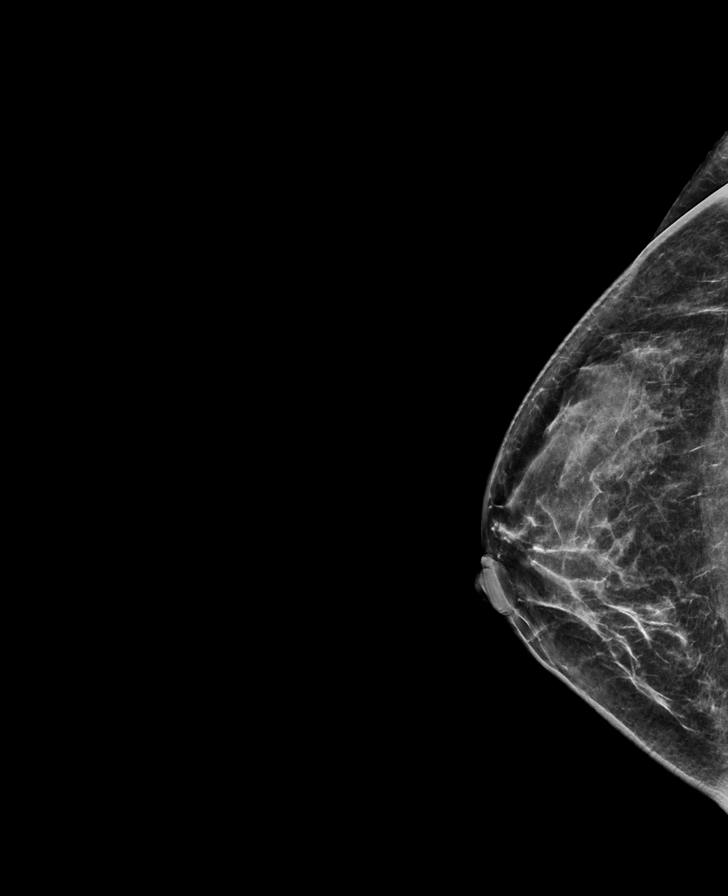

[L CC synth-2D]
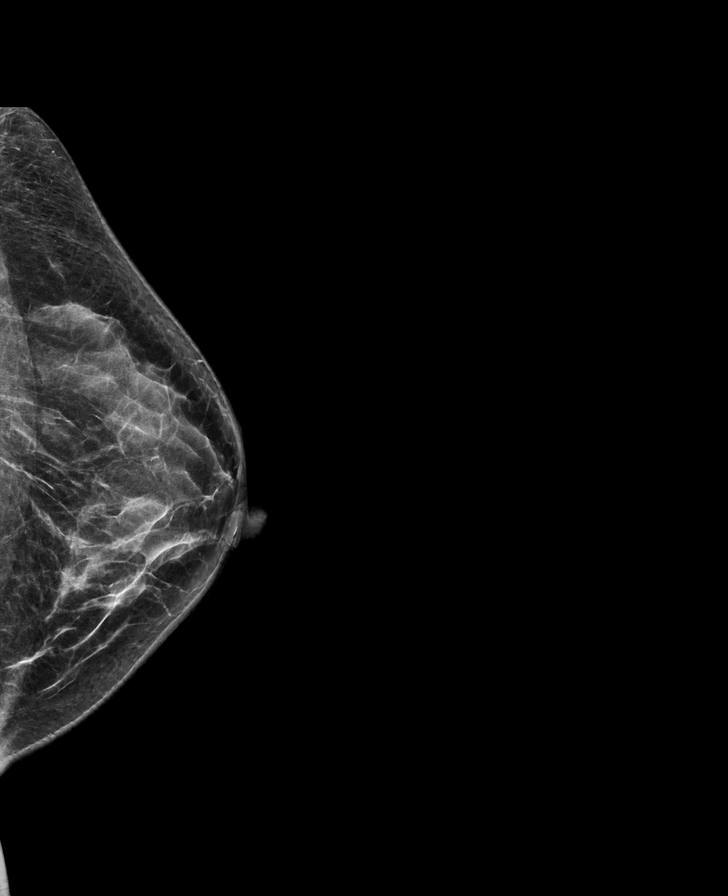

[L CC tomo · 2 of 61 frames shown]
[frame 20/61]
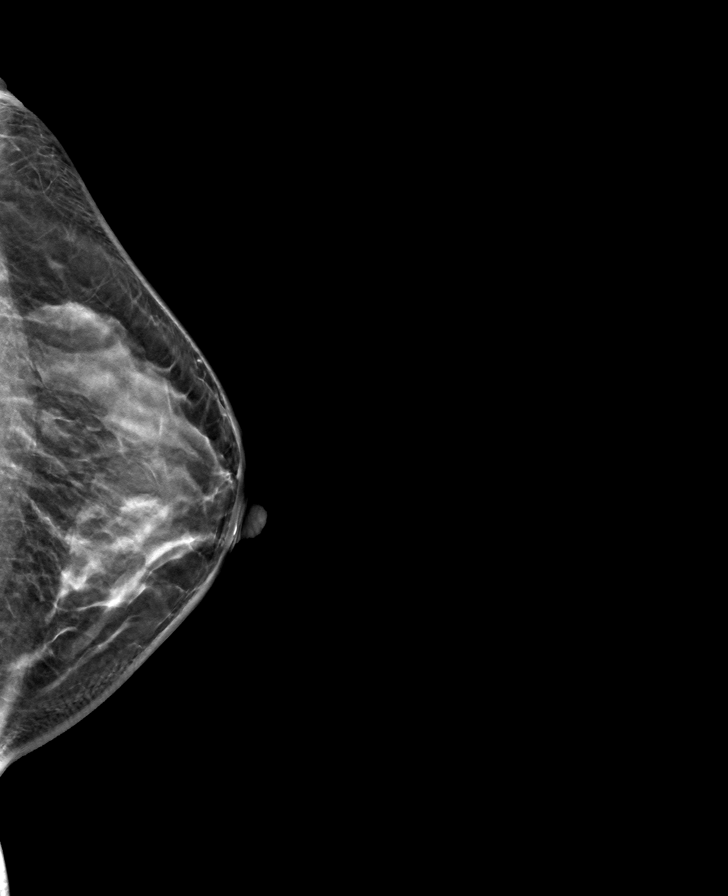
[frame 31/61]
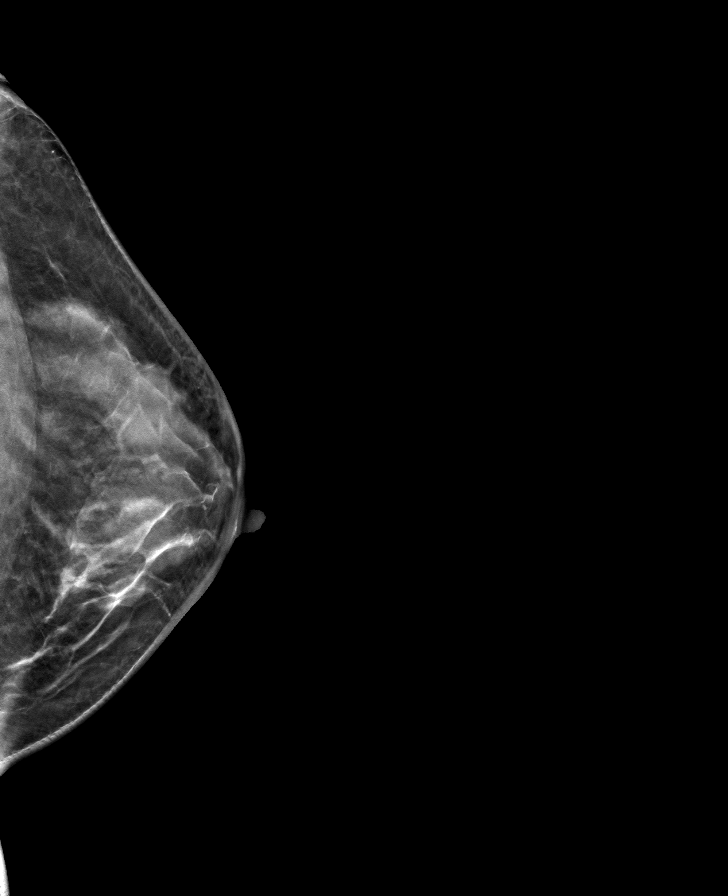

[L MLO tomo · tomo slice 29/57.0]
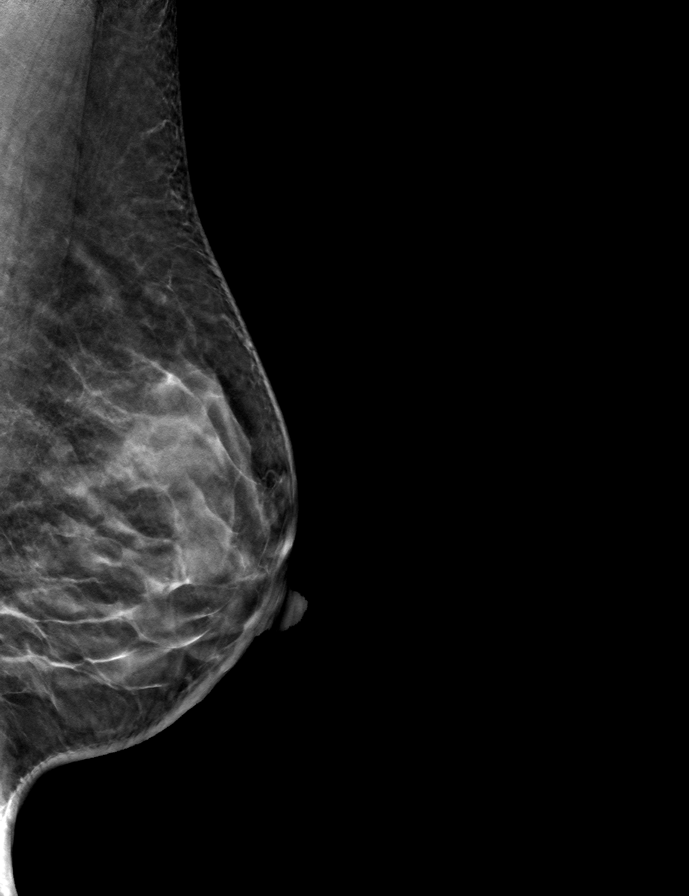

[R MLO tomo · tomo slice 27/52.0]
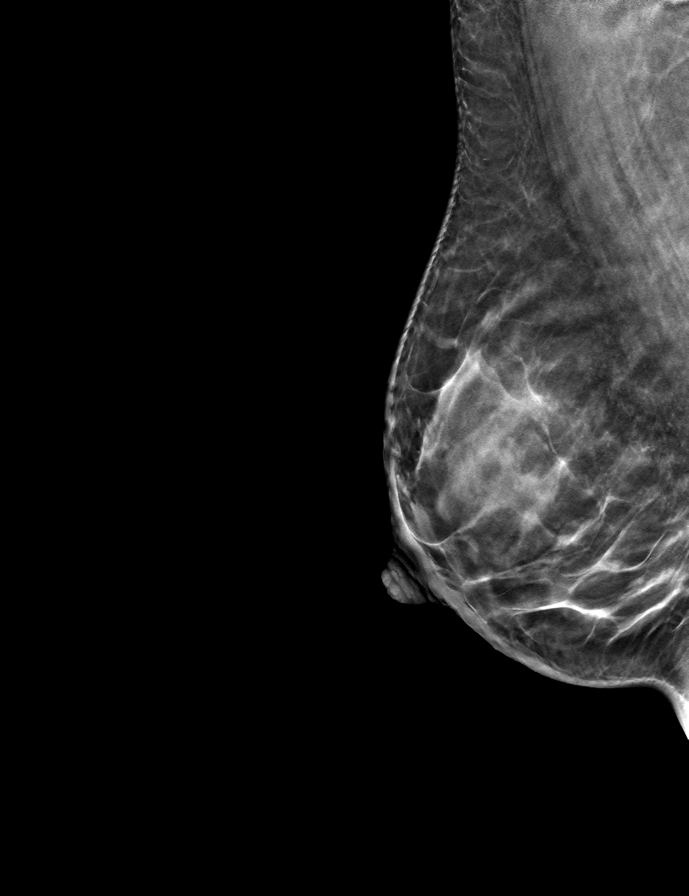

[R CC tomo · tomo slice 29/57.0]
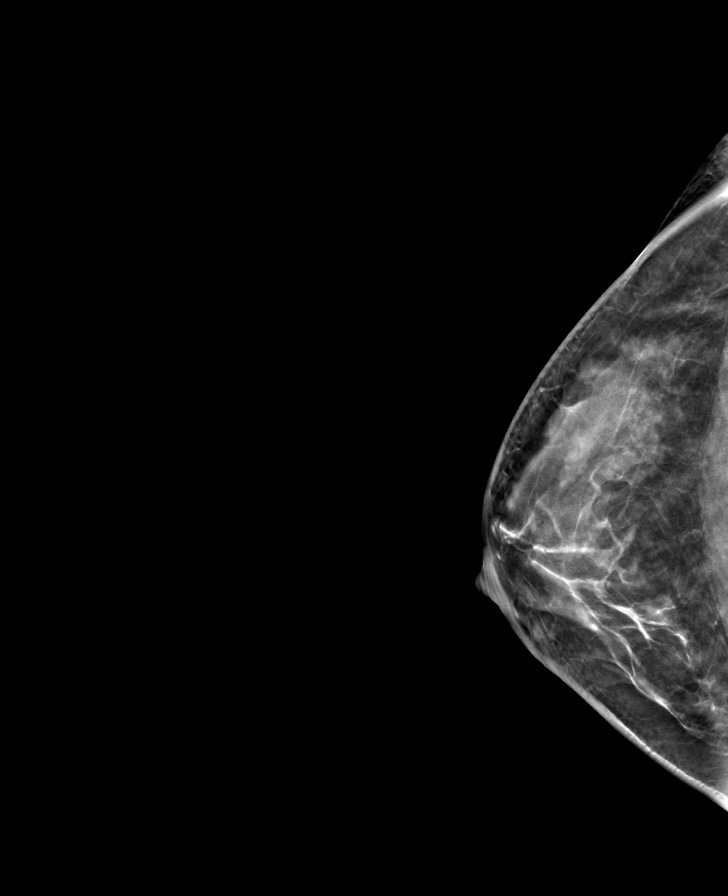

[9 of 24 positions shown; findings below may reference images not displayed]

ACR Breast Density Category c: The breast tissue is heterogeneously
dense, which may obscure small masses.
FINDINGS: There are no findings suspicious for malignancy. Images were
processed with CAD.
IMPRESSION: No mammographic evidence of malignancy. A result letter of this
screening mammogram will be mailed directly to the patient.

RECOMMENDATION:
Screening mammogram in one year. (Code:[id number]-[id number])

BI-RADS CATEGORY  1: Negative.

## 2019-07-21 ENCOUNTER — Encounter: Payer: Self-pay | Admitting: Emergency Medicine

## 2019-07-21 ENCOUNTER — Emergency Department: Payer: Commercial Managed Care - PPO

## 2019-07-21 ENCOUNTER — Emergency Department
Admission: EM | Admit: 2019-07-21 | Discharge: 2019-07-21 | Disposition: A | Discharge status: Home / Self Care | Payer: Commercial Managed Care - PPO | Attending: Emergency Medicine | Admitting: Emergency Medicine

## 2019-07-21 ENCOUNTER — Other Ambulatory Visit: Payer: Self-pay

## 2019-07-21 DIAGNOSIS — R0789 Other chest pain: Secondary | ICD-10-CM | POA: Insufficient documentation

## 2019-07-21 DIAGNOSIS — K449 Diaphragmatic hernia without obstruction or gangrene: Secondary | ICD-10-CM | POA: Insufficient documentation

## 2019-07-21 DIAGNOSIS — J986 Disorders of diaphragm: Secondary | ICD-10-CM | POA: Insufficient documentation

## 2019-07-21 LAB — TROPONIN I (HIGH SENSITIVITY): Troponin I (High Sensitivity): <2 ng/L (ref <18)

## 2019-07-21 LAB — BASIC METABOLIC PANEL
Anion gap: 11 (ref 5–15)
BUN: 14 mg/dL (ref 6–20)
CO2: 26 mmol/L (ref 22–32)
Calcium: 9.2 mg/dL (ref 8.9–10.3)
Chloride: 101 mmol/L (ref 98–111)
Creatinine, Ser: 0.57 mg/dL (ref 0.44–1.00)
GFR calc Af Amer: >60 mL/min (ref >60)
GFR calc non Af Amer: >60 mL/min (ref >60)
Glucose, Bld: 113 mg/dL — ABNORMAL HIGH (ref 70–99)
Potassium: 3.3 mmol/L — ABNORMAL LOW (ref 3.5–5.1)
Sodium: 138 mmol/L (ref 135–145)

## 2019-07-21 LAB — HEPATIC FUNCTION PANEL
ALT: 14 U/L (ref 0–44)
AST: 16 U/L (ref 15–41)
Albumin: 4.2 g/dL (ref 3.5–5.0)
Alkaline Phosphatase: 80 U/L (ref 38–126)
Bilirubin, Direct: 0.1 mg/dL (ref 0.0–0.2)
Indirect Bilirubin: 0.7 mg/dL (ref 0.3–0.9)
Total Bilirubin: 0.8 mg/dL (ref 0.3–1.2)
Total Protein: 8 g/dL (ref 6.5–8.1)

## 2019-07-21 LAB — CBC
HCT: 35.7 % — ABNORMAL LOW (ref 36.0–46.0)
Hemoglobin: 12 g/dL (ref 12.0–15.0)
MCH: 30.2 pg (ref 26.0–34.0)
MCHC: 33.6 g/dL (ref 30.0–36.0)
MCV: 89.7 fL (ref 80.0–100.0)
Platelets: 225 10*3/uL (ref 150–400)
RBC: 3.98 MIL/uL (ref 3.87–5.11)
RDW: 11.9 % (ref 11.5–15.5)
WBC: 5.3 10*3/uL (ref 4.0–10.5)
nRBC: 0 % (ref 0.0–0.2)

## 2019-07-21 LAB — LIPASE, BLOOD: Lipase: 20 U/L (ref 11–51)

## 2019-07-21 LAB — POCT PREGNANCY, URINE: Preg Test, Ur: NEGATIVE

## 2019-07-21 MED ORDER — IOHEXOL 350 MG/ML SOLN
75.0000 mL | Freq: Once | INTRAVENOUS | Status: AC | PRN
  Administered 2019-07-21: 75 mL via INTRAVENOUS

## 2019-07-21 MED ORDER — TRAMADOL HCL 50 MG PO TABS: 50.0000 mg | ORAL_TABLET | Freq: Four times a day (QID) | ORAL | 0 refills | Status: DC | PRN

## 2019-07-21 MED ORDER — LIDOCAINE VISCOUS HCL 2 % MT SOLN
15.0000 mL | Freq: Once | OROMUCOSAL | Status: AC
  Administered 2019-07-21: 15 mL via ORAL
  Filled 2019-07-21: qty 15

## 2019-07-21 MED ORDER — PANTOPRAZOLE SODIUM 40 MG PO TBEC: 40.0000 mg | DELAYED_RELEASE_TABLET | Freq: Every day | ORAL | 0 refills | Status: AC

## 2019-07-21 MED ORDER — SUCRALFATE 1 G PO TABS: 1.0000 g | ORAL_TABLET | Freq: Three times a day (TID) | ORAL | 0 refills | Status: AC

## 2019-07-21 MED ORDER — ALUM & MAG HYDROXIDE-SIMETH 200-200-20 MG/5ML PO SUSP
30.0000 mL | Freq: Once | ORAL | Status: AC
  Administered 2019-07-21: 30 mL via ORAL
  Filled 2019-07-21: qty 30

## 2019-07-21 MED ORDER — KETOROLAC TROMETHAMINE 30 MG/ML IJ SOLN
15.0000 mg | Freq: Once | INTRAMUSCULAR | Status: AC
  Administered 2019-07-21: 15 mg via INTRAVENOUS
  Filled 2019-07-21: qty 1

## 2019-07-21 MED ORDER — ONDANSETRON HCL 4 MG/2ML IJ SOLN
4.0000 mg | Freq: Once | INTRAMUSCULAR | Status: AC
  Administered 2019-07-21: 4 mg via INTRAVENOUS
  Filled 2019-07-21: qty 2

## 2019-07-21 MED ORDER — MORPHINE SULFATE (PF) 4 MG/ML IV SOLN
4.0000 mg | Freq: Once | INTRAVENOUS | Status: DC
  Filled 2019-07-21: qty 1

## 2019-07-21 NOTE — Discharge Instructions (Addendum)
Take the Protonix and Carafate for indigestion/acid reflux  and your hiatal hernia, which makes you prone to acid reflux  I've also prescribed a low dose of pain medication for the next few days to help with your pain  Avoid spicy foods or foods high in acid

## 2019-07-21 NOTE — ED Provider Notes (Signed)
[facility] Emergency Department Provider Note  ____________________________________________   First MD Initiated Contact with Patient 07/21/19 1200     (approximate)  I have reviewed the triage vital signs and the nursing notes.   HISTORY  Chief Complaint Chest Pain     HPI [place] is a 53 y.o. female  Here with left chest pain. Pt reports that for the past 2-3 days, she's had ongoing intermittent L rib cage pain, LUQ pain.  Pt reports that the pain began fairly suddenly. It is primarily localized along the left lower costal margin and upper abdomen, and is worse with movement but also with eating. Denies any alleviating factors. No cough or SOB. No leg swelling. No h/o similar symptoms. She does report a h/o chronic gastritis/GERD and stomach issues, has had EGD in the past. Denies any change in stool. No other complaints.       History reviewed. No pertinent past medical history.  There are no active problems to display for this patient.   Past Surgical History:  Procedure Laterality Date   BIOPSY THYROID     TUBAL LIGATION      Prior to Admission medications   Medication Sig Start Date End Date Taking? Authorizing Provider  chlorpheniramine-HYDROcodone (TUSSIONEX PENNKINETIC ER) 10-8 MG/5ML LQCR Take 5 mLs by mouth every 12 (twelve) hours as needed for cough (cough). 12/05/14   Ivar Drape D, PA  ibuprofen (ADVIL,MOTRIN) 200 MG tablet Take 200 mg by mouth every 6 (six) hours as needed. For pain    [provider]  pantoprazole (PROTONIX) 40 MG tablet Take 1 tablet (40 mg total) by mouth daily for 7 days. 07/21/19 07/28/19  Duffy Bruce, MD  phenazopyridine (PYRIDIUM) 200 MG tablet Take 1 tablet (200 mg total) by mouth 3 (three) times daily as needed for pain. Patient not taking: Reported on 12/05/2014 10/13/13   Roselee Culver, MD  sucralfate (CARAFATE) 1 g tablet Take 1 tablet (1 g total) by mouth 4 (four)  times daily -  with meals and at bedtime for 7 days. 07/21/19 07/28/19  Duffy Bruce, MD  sulfamethoxazole-trimethoprim (BACTRIM DS) 800-160 MG per tablet Take 1 tablet by mouth 2 (two) times daily. Patient not taking: Reported on 12/05/2014 10/13/13   Roselee Culver, MD  traMADol (ULTRAM) 50 MG tablet Take 1 tablet (50 mg total) by mouth every 6 (six) hours as needed for severe pain. 07/21/19 07/20/20  Duffy Bruce, MD    Allergies Patient has no known allergies.  Family History  Problem Relation Age of Onset   Breast cancer Sister 94    Social History Social History   Tobacco Use   Smoking status: Never Smoker   Smokeless tobacco: Never Used  Substance Use Topics   Alcohol use: Yes    Comment: sometime   Drug use: No    Review of Systems  Review of Systems  Constitutional: Negative for fatigue and fever.  HENT: Negative for congestion and sore throat.   Eyes: Negative for visual disturbance.  Respiratory: Positive for chest tightness. Negative for cough and shortness of breath.   Cardiovascular: Positive for chest pain.  Gastrointestinal: Negative for abdominal pain, diarrhea, nausea and vomiting.  Genitourinary: Negative for flank pain.  Musculoskeletal: Negative for back pain and neck pain.  Skin: Negative for rash and wound.  Neurological: Negative for weakness.  All other systems reviewed and are negative.    ____________________________________________  PHYSICAL EXAM:      VITAL  SIGNS: ED Triage Vitals  Enc Vitals Group     BP 07/21/19 1014 136/86     Pulse Rate 07/21/19 1014 76     Resp 07/21/19 1014 18     Temp 07/21/19 1014 98.9 F (37.2 C)     Temp Source 07/21/19 1014 Oral     SpO2 07/21/19 1014 98 %     Weight 07/21/19 1024 140 lb (63.5 kg)     Height 07/21/19 1024  (1.549 m)     Head Circumference --      Peak Flow --      Pain Score 07/21/19 1024 4     Pain Loc --      Pain Edu? --      Excl. in GC? --      Physical  Exam Vitals signs and nursing note reviewed.  Constitutional:      General: She is not in acute distress.    Appearance: She is well-developed.  HENT:     Head: Normocephalic and atraumatic.  Eyes:     Conjunctiva/sclera: Conjunctivae normal.  Neck:     Musculoskeletal: Neck supple.  Cardiovascular:     Rate and Rhythm: Normal rate and regular rhythm.     Heart sounds: Normal heart sounds. No murmur. No friction rub.  Pulmonary:     Effort: Pulmonary effort is normal. No respiratory distress.     Breath sounds: Normal breath sounds. No wheezing or rales.  Chest:     Comments: Moderate TTP over left lower costal margin, involving LUQ as below Abdominal:     General: There is no distension.     Palpations: Abdomen is soft.     Tenderness: There is no abdominal tenderness.     Comments: Moderate TTP over LUQ, particularly subcostal area, with no palpable splenomegaly  Skin:    General: Skin is warm.     Capillary Refill: Capillary refill takes less than 2 seconds.  Neurological:     Mental Status: She is alert and oriented to person, place, and time.     Motor: No abnormal muscle tone.       ____________________________________________   LABS (all labs ordered are listed, but only abnormal results are displayed)  Labs Reviewed  BASIC METABOLIC PANEL - Abnormal; Notable for the following components:      Result Value   Potassium 3.3 (*)    Glucose, Bld 113 (*)    All other components within normal limits  CBC - Abnormal; Notable for the following components:   HCT 35.7 (*)    All other components within normal limits  HEPATIC FUNCTION PANEL  LIPASE, BLOOD  POC URINE PREG, ED  POCT PREGNANCY, URINE  TROPONIN I (HIGH SENSITIVITY)    ____________________________________________  HYQ:MVHQIO sinus rhythm, VR 75. PR 136, QRS 82, QTc 426. No acute ST-t changes. No significant change from prior. ________________________________________  RADIOLOGY All imaging,  including plain films, CT scans, and ultrasounds, independently reviewed by me, and interpretations confirmed via formal radiology reads.  ED MD interpretation:   CXR: Clear CT Chest: No acute abnormality; thyroid nodule noted  Official radiology report(s): Dg Chest 2 View  Result Date: 07/21/2019 CLINICAL DATA:  Left chest pain EXAM: CHEST - 2 VIEW COMPARISON:  03/03/2007 FINDINGS: The heart size and mediastinal contours are within normal limits. Unchanged elevation of the left hemidiaphragm. The visualized skeletal structures are unremarkable. IMPRESSION: No acute abnormality of the lungs. Unchanged elevation of the left hemidiaphragm. Electronically Signed  By: Lauralyn PrimesAlex  Bibbey M.D.   On: 07/21/2019 12:26   Ct Angio Chest Pe W And/or Wo Contrast  Result Date: 07/21/2019 CLINICAL DATA:  PE suspected EXAM: CT ANGIOGRAPHY CHEST WITH CONTRAST TECHNIQUE: Multidetector CT imaging of the chest was performed using the standard protocol during bolus administration of intravenous contrast. Multiplanar CT image reconstructions and MIPs were obtained to evaluate the vascular anatomy. CONTRAST:  75mL OMNIPAQUE IOHEXOL 350 MG/ML SOLN COMPARISON:  None. FINDINGS: Cardiovascular: Satisfactory opacification of the pulmonary arteries to the segmental level. No evidence of pulmonary embolism. Normal heart size. No pericardial effusion. Mediastinum/Nodes: No enlarged mediastinal, hilar, or axillary lymph nodes. Very bulky, heterogeneous right lobe of the thyroid, incompletely imaged although measuring at least 7.1 x 5.7 x 6.1 cm and deflecting the trachea leftward. Esophagus demonstrates no significant findings. Lungs/Pleura: Lungs are clear. No pleural effusion or pneumothorax. Upper Abdomen: No acute abnormality.  Cholelithiasis. Musculoskeletal: No chest wall abnormality. No acute or significant osseous findings. Review of the MIP images confirms the above findings. IMPRESSION: 1.  Negative examination for pulmonary  embolism. 2. Very bulky, heterogeneous right lobe of the thyroid, incompletely imaged although measuring at least 7.1 x 5.7 x 6.1 cm and deflecting the trachea leftward. This may be symptomatic. Recommend dedicated thyroid ultrasound to further evaluate on a nonemergent basis. Electronically Signed   By: Lauralyn PrimesAlex  Bibbey M.D.   On: 07/21/2019 13:54    ____________________________________________  PROCEDURES   Procedure(s) performed (including Critical Care):  Procedures  ____________________________________________  INITIAL IMPRESSION / MDM / ASSESSMENT AND PLAN / ED COURSE  As part of my medical decision making, I reviewed the following data within the electronic MEDICAL RECORD NUMBER Notes from prior ED visits and [place] Controlled Substance Database      *[facility] Chauncy PassyBello Bernal was evaluated in Emergency Department on 07/21/2019 for the symptoms described in the history of present illness. She was evaluated in the context of the global COVID-19 pandemic, which necessitated consideration that the patient might be at risk for infection with the SARS-CoV-2 virus that causes COVID-19. Institutional protocols and algorithms that pertain to the evaluation of patients at risk for COVID-19 are in a state of rapid change based on information released by regulatory bodies including the CDC and federal and state organizations. These policies and algorithms were followed during the patient's care in the ED.  Some ED evaluations and interventions may be delayed as a result of limited staffing during the pandemic.*      Medical Decision Making:  53 yo F here with atypical, reproducible left chest and abdomen pain. DDx is broad. Pt has markedly elevated hemidiaphragm with prior diagnosis of hiatal hernia, with pain reproduced upon eating, raising some question of underlying GERD/gastritis/dyspepsia. DDx also includes MSK chest wall pain given positional nature of pain, though no specific triggers. No EKG signs or  history to suggest pericarditis. CT Angio PE is negative for PE, PNA, PTX. Trop neg depsite constant symptoms >12 hours, EKG is nonischemic and I do not suspect ACS. Will treat with antacids, brief course of analgesics (avoiding NSAIDs in setting of possible gastritis), and d/c home with outpt follow-up.  ____________________________________________  FINAL CLINICAL IMPRESSION(S) / ED DIAGNOSES  Final diagnoses:  Atypical chest pain  Elevated hemidiaphragm  Hiatal hernia     MEDICATIONS GIVEN DURING THIS VISIT:  Medications  alum & mag hydroxide-simeth (MAALOX/MYLANTA) 200-200-20 MG/5ML suspension 30 mL (30 mLs Oral Given 07/21/19 1416)    And  lidocaine (XYLOCAINE) 2 % viscous mouth  solution 15 mL (15 mLs Oral Given 07/21/19 1416)  ondansetron (ZOFRAN) injection 4 mg (4 mg Intravenous Given 07/21/19 1417)  iohexol (OMNIPAQUE) 350 MG/ML injection 75 mL (75 mLs Intravenous Contrast Given 07/21/19 1322)  ketorolac (TORADOL) 30 MG/ML injection 15 mg (15 mg Intravenous Given 07/21/19 1423)     ED Discharge Orders         Ordered    pantoprazole (PROTONIX) 40 MG tablet  Daily     07/21/19 1527    sucralfate (CARAFATE) 1 g tablet  3 times daily with meals & bedtime     07/21/19 1527    traMADol (ULTRAM) 50 MG tablet  Every 6 hours PRN     07/21/19 1527           Note:  This document was prepared using Dragon voice recognition software and may include unintentional dictation errors.   Shaune Pollack, MD 07/21/19 [place] [place]

## 2019-07-21 NOTE — ED Triage Notes (Signed)
Pt here for left mid axillary pain that radiates to LUQ and left chest.  If pt breathes shallow feels like cannot get enough oxygen and when takes a big breath pain increases. No travel or birth control.  Is a smoker. No fever or cough.

## 2019-07-21 NOTE — ED Notes (Signed)
First Nurse Note: Pt to ED via POV c/o left rib and back pain x 3 days. Pt is in NAD at this time.

## 2020-04-08 ENCOUNTER — Encounter: Payer: Self-pay | Admitting: Gastroenterology

## 2020-06-02 ENCOUNTER — Ambulatory Visit (AMBULATORY_SURGERY_CENTER): Payer: Self-pay

## 2020-06-02 ENCOUNTER — Encounter: Payer: Self-pay | Admitting: Gastroenterology

## 2020-06-02 ENCOUNTER — Other Ambulatory Visit: Payer: Self-pay

## 2020-06-02 VITALS — Ht 61.0 in | Wt 138.0 lb

## 2020-06-02 DIAGNOSIS — Z1211 Encounter for screening for malignant neoplasm of colon: Secondary | ICD-10-CM

## 2020-06-02 MED ORDER — NA SULFATE-K SULFATE-MG SULF 17.5-3.13-1.6 GM/177ML PO SOLN: 1.0000 | Freq: Once | ORAL | 0 refills | Status: AC

## 2020-06-02 NOTE — Progress Notes (Signed)
No egg or soy allergy known to patient  No issues with past sedation with any surgeries or procedures no intubation problems in the past  No FH of Malignant Hyperthermia No diet pills per patient No home 02 use per patient  No blood thinners per patient  Pt denies issues with constipation  No A fib or A flutter  EMMI video to pt or via MyChart  COVID 19 guidelines implemented in [facility] today with Pt and RN   Suprep Coupon given to pt in [facility] today , Code to Pharmacy   Pt speaks english as a second language and is able to understand and speak english fluently.  Due to the COVID-19 pandemic we are asking patients to follow these guidelines. Please only bring one care partner. Please be aware that your care partner may wait in the car in the parking lot or if they feel like they will be too hot to wait in the car, they may wait in the lobby on the 4th floor. All care partners are required to wear a mask the entire time (we do not have any that we can provide them), they need to practice social distancing, and we will do a Covid check for all patient's and care partners when you arrive. Also we will check their temperature and your temperature. If the care partner waits in their car they need to stay in the parking lot the entire time and we will call them on their cell phone when the patient is ready for discharge so they can bring the car to the front of the building. Also all patient's will need to wear a mask into building.

## 2020-06-16 ENCOUNTER — Encounter: Payer: Self-pay | Admitting: Gastroenterology

## 2020-06-16 ENCOUNTER — Ambulatory Visit (AMBULATORY_SURGERY_CENTER): Payer: Commercial Managed Care - PPO | Admitting: Gastroenterology

## 2020-06-16 ENCOUNTER — Other Ambulatory Visit: Payer: Self-pay

## 2020-06-16 VITALS — BP 93/57 | HR 61 | Temp 98.7°F | Resp 12 | Ht 61.0 in | Wt 138.0 lb

## 2020-06-16 DIAGNOSIS — Z1211 Encounter for screening for malignant neoplasm of colon: Secondary | ICD-10-CM

## 2020-06-16 MED ORDER — SODIUM CHLORIDE 0.9 % IV SOLN: 500.0000 mL | Freq: Once | INTRAVENOUS | Status: DC

## 2020-06-16 NOTE — Progress Notes (Signed)
PT taken to PACU. Monitors in place. VSS. Report given to RN. 

## 2020-06-16 NOTE — Progress Notes (Signed)
VS-CW  Pt's states no medical or surgical changes since previsit or office visit.  

## 2020-06-16 NOTE — Patient Instructions (Signed)
Discharge instructions given. Handouts on Diverticulosis and High fiber diet. Resume previous diet. YOU HAD AN ENDOSCOPIC PROCEDURE TODAY AT THE [facility]:   Refer to the procedure report that was given to you for any specific questions about what was found during the examination.  If the procedure report does not answer your questions, please call your gastroenterologist to clarify.  If you requested that your care partner not be given the details of your procedure findings, then the procedure report has been included in a sealed envelope for you to review at your convenience later.  YOU SHOULD EXPECT: Some feelings of bloating in the abdomen. Passage of more gas than usual.  Walking can help get rid of the air that was put into your GI tract during the procedure and reduce the bloating. If you had a lower endoscopy (such as a colonoscopy or flexible sigmoidoscopy) you may notice spotting of blood in your stool or on the toilet paper. If you underwent a bowel prep for your procedure, you may not have a normal bowel movement for a few days.  Please Note:  You might notice some irritation and congestion in your nose or some drainage.  This is from the oxygen used during your procedure.  There is no need for concern and it should clear up in a day or so.  SYMPTOMS TO REPORT IMMEDIATELY:   Following lower endoscopy (colonoscopy or flexible sigmoidoscopy):  Excessive amounts of blood in the stool  Significant tenderness or worsening of abdominal pains  Swelling of the abdomen that is new, acute  Fever of 100F or higher   For urgent or emergent issues, a gastroenterologist can be reached at any hour by calling [phone]. Do not use MyChart messaging for urgent concerns.    DIET:  We do recommend a small meal at first, but then you may proceed to your regular diet.  Drink plenty of fluids but you should avoid alcoholic beverages for 24 hours.  ACTIVITY:  You should plan to  take it easy for the rest of today and you should NOT DRIVE or use heavy machinery until tomorrow (because of the sedation medicines used during the test).    FOLLOW UP: Our staff will call the number listed on your records 48-72 hours following your procedure to check on you and address any questions or concerns that you may have regarding the information given to you following your procedure. If we do not reach you, we will leave a message.  We will attempt to reach you two times.  During this call, we will ask if you have developed any symptoms of COVID 19. If you develop any symptoms (ie: fever, flu-like symptoms, shortness of breath, cough etc.) before then, please call [phone].  If you test positive for Covid 19 in the 2 weeks post procedure, please call and report this information to [place].    If any biopsies were taken you will be contacted by phone or by letter within the next 1-3 weeks.  Please call us at [phone] if you have not heard about the biopsies in 3 weeks.    SIGNATURES/CONFIDENTIALITY: You and/or your care partner have signed paperwork which will be entered into your electronic medical record.  These signatures attest to the fact that that the information above on your After Visit Summary has been reviewed and is understood.  Full responsibility of the confidentiality of this discharge information lies with you and/or your care-partner.

## 2020-06-16 NOTE — Op Note (Signed)
[facility] Patient Name: Amy Wyatt Procedure Date: 06/16/2020 9:43 AM MRN: [id number] Endoscopist: Meryl Dare , MD Age: 54 Referring MD:  Date of Birth: 11-10-1965 Gender: Female Account #: [id number] Procedure:                Colonoscopy Indications:              Screening for colorectal malignant neoplasm Medicines:                Monitored Anesthesia Care Procedure:                Pre-Anesthesia Assessment:                           - Prior to the procedure, a History and Physical                            was performed, and patient medications and                            allergies were reviewed. The patient's tolerance of                            previous anesthesia was also reviewed. The risks                            and benefits of the procedure and the sedation                            options and risks were discussed with the patient.                            All questions were answered, and informed consent                            was obtained. Prior Anticoagulants: The patient has                            taken no previous anticoagulant or antiplatelet                            agents. ASA Grade Assessment: II - A patient with                            mild systemic disease. After reviewing the risks                            and benefits, the patient was deemed in                            satisfactory condition to undergo the procedure.                           After obtaining informed consent, the colonoscope  was passed under direct vision. Throughout the                            procedure, the patient's blood pressure, pulse, and                            oxygen saturations were monitored continuously. The                            Colonoscope was introduced through the anus and                            advanced to the the cecum, identified by                            appendiceal orifice  and ileocecal valve. The                            ileocecal valve, appendiceal orifice, and rectum                            were photographed. The quality of the bowel                            preparation was excellent. The colonoscopy was                            performed without difficulty. The patient tolerated                            the procedure well. Scope In: 9:44:40 AM Scope Out: 9:55:47 AM Scope Withdrawal Time: 0 hours 7 minutes 57 seconds  Total Procedure Duration: 0 hours 11 minutes 7 seconds  Findings:                 The perianal and digital rectal examinations were                            normal.                           A few medium-mouthed diverticula were found in the                            ascending colon and left colon. There was no                            evidence of diverticular bleeding.                           The exam was otherwise without abnormality on                            direct and retroflexion views. Complications:            No immediate complications. Estimated blood  loss:                            None. Estimated Blood Loss:     Estimated blood loss: none. Impression:               - Mild diverticulosis in the ascending colon and in                            the left colon. There was no evidence of                            diverticular bleeding.                           - The examination was otherwise normal on direct                            and retroflexion views.                           - No specimens collected. Recommendation:           - Repeat colonoscopy in 10 years for screening                            purposes.                           - Patient has a contact number available for                            emergencies. The signs and symptoms of potential                            delayed complications were discussed with the                            patient. Return to normal activities tomorrow.                             Written discharge instructions were provided to the                            patient.                           - High fiber diet.                           - Continue present medications. Meryl Dare, MD 06/16/2020 9:59:01 AM This report has been signed electronically.

## 2020-06-18 ENCOUNTER — Telehealth: Payer: Self-pay

## 2020-06-18 NOTE — Telephone Encounter (Signed)
  Follow up Call-  Call back number 06/16/2020  Post procedure Call Back phone  # [phone]  Permission to leave phone message Yes  Some recent data might be hidden     Patient questions:  Do you have a fever, pain , or abdominal swelling? No. Pain Score  0 *  Have you tolerated food without any problems? Yes.    Have you been able to return to your normal activities? Yes.    Do you have any questions about your discharge instructions: Diet   No. Medications  No. Follow up visit  No.  Do you have questions or concerns about your Care? No.  Actions: * If pain score is 4 or above: No action needed, pain <4.  1. Have you developed a fever since your procedure? no  2.   Have you had an respiratory symptoms (SOB or cough) since your procedure? no  3.   Have you tested positive for COVID 19 since your procedure no  4.   Have you had any family members/close contacts diagnosed with the COVID 19 since your procedure?  no   If yes to any of these questions please route to Laverna Peace, RN and Karlton Lemon, RN
